# Patient Record
Sex: Female | Born: 1995 | Race: Black or African American | Hispanic: No | Marital: Single | Smoking: Never smoker
Health system: Southern US, Community
[De-identification: ages and names within clinical notes are randomized; demographics above are authoritative.]

## PROBLEM LIST (undated history)

## (undated) HISTORY — PX: NO PAST SURGERIES: SHX2092

---
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
## 2014-02-27 ENCOUNTER — Ambulatory Visit: Payer: Self-pay | Admitting: Family Medicine

## 2014-02-27 LAB — URINALYSIS, COMPLETE
Blood: NEGATIVE
Glucose,UR: NEGATIVE mg/dL (ref 0–75)
KETONE: NEGATIVE
Leukocyte Esterase: NEGATIVE
NITRITE: NEGATIVE
Ph: 6 (ref 4.5–8.0)
RBC, UR: NONE SEEN /HPF (ref 0–5)
Specific Gravity: 1.025 (ref 1.003–1.030)

## 2014-02-27 LAB — PREGNANCY, URINE: PREGNANCY TEST, URINE: NEGATIVE m[IU]/mL

## 2016-02-13 ENCOUNTER — Encounter: Payer: Self-pay | Admitting: Emergency Medicine

## 2016-02-13 ENCOUNTER — Ambulatory Visit
Admission: EM | Admit: 2016-02-13 | Discharge: 2016-02-13 | Disposition: A | Discharge status: Home / Self Care | Attending: Family Medicine | Admitting: Family Medicine

## 2016-02-13 DIAGNOSIS — N946 Dysmenorrhea, unspecified: Secondary | ICD-10-CM | POA: Diagnosis not present

## 2016-02-13 LAB — URINALYSIS COMPLETE WITH MICROSCOPIC (ARMC ONLY)
Bilirubin Urine: NEGATIVE
Glucose, UA: NEGATIVE mg/dL
Ketones, ur: NEGATIVE mg/dL
Leukocytes, UA: NEGATIVE
Nitrite: NEGATIVE
PROTEIN: 30 mg/dL — AB
SPECIFIC GRAVITY, URINE: 1.015 (ref 1.005–1.030)
pH: 8.5 — ABNORMAL HIGH (ref 5.0–8.0)

## 2016-02-13 LAB — PREGNANCY, URINE: Preg Test, Ur: NEGATIVE

## 2016-02-13 MED ORDER — KETOROLAC TROMETHAMINE 60 MG/2ML IM SOLN
60.0000 mg | Freq: Once | INTRAMUSCULAR | Status: AC
  Administered 2016-02-13: 60 mg via INTRAMUSCULAR

## 2016-02-13 NOTE — ED Provider Notes (Signed)
[facility]  ____________________________________________  Time seen: Approximately 2:30 PM  I have reviewed the triage vital signs and the nursing notes.   HISTORY  Chief Complaint Menstrual Problem  HPI Amber Higgins is a 20 y.o. female presents with a complaint of painful menstrual cramps. Patient states that her pain woke her up at approximately 3 or 4 AM this morning and states a few hours later she began with her menstrual bleeding. Patient states that this is her normal pattern as long as she can remember of her having her period. Patient states that the first day of her menstrual has always been accompanied with painful pelvic cramping. Patient states that normally when she noted she is about to begin her menstrual she will take Advil or Aleve a few times the day before which helps the severity of the pain. Patient states that she forgot to do this yesterday and states that she feels like this is why the pain was more today. Patient reports that she did take Aleve this morning and states that currently her pain is mild. Patient states that her pain is much improved with Aleve this morning. Patient states the pain was more significant when she first checked in to the urgent care but states as she laid down and rested in the exam room she began to feel better. Patient states current pain is 3 out of 10 to lower pelvic. Patient states the pain is already across lower pelvis and not just to one side.   patient again reports that this is consistent with her regular menstrual cycles. Denies any atypical pain or symptoms. Patient states that she has only gone through one menstrual pad today. Denies any heavy or irregular bleeding. Patient reports that she is not sexually active. Denies any recent abnormal vaginal discharge, vaginal odor, vaginal pain, dysuria or back pain. Denies fevers or recent sickness. Patient reports that she has never followed with OB/GYN in the past for this. Reports  she has never taken oral contraceptives or other methods of controlling her menstrual.  Patient reports she was seen in ER in 2012 for the same complaints and had a normal pelvic ultrasound.   Patient's last menstrual period was 02/13/2016 (exact date).   History reviewed. No pertinent past medical history. denies There are no active problems to display for this patient. denies  History reviewed. No pertinent past surgical history. Denies  No current outpatient prescriptions on file.  Allergies Review of patient's allergies indicates no known allergies.  History reviewed. No pertinent family history.  Social History Social History  Substance Use Topics  . Smoking status: Never Smoker   . Smokeless tobacco: None  . Alcohol Use: No    Review of Systems Constitutional: No fever/chills Eyes: No visual changes. ENT: No sore throat. Cardiovascular: Denies chest pain. Respiratory: Denies shortness of breath. Gastrointestinal: As above. No nausea, no vomiting.  No diarrhea.  No constipation. Genitourinary: Negative for dysuria. Musculoskeletal: Negative for back pain. Skin: Negative for rash. Neurological: Negative for headaches, focal weakness or numbness.  10-point ROS otherwise negative.  ____________________________________________   PHYSICAL EXAM:  VITAL SIGNS: ED Triage Vitals  Enc Vitals Group     BP 02/13/16 1254 116/82 mmHg     Pulse Rate 02/13/16 1254 71     Resp 02/13/16 1254 16     Temp 02/13/16 1254 98.2 F (36.8 C)     Temp Source 02/13/16 1254 Oral     SpO2 02/13/16 1254 100 %  Weight 02/13/16 1254 119 lb (53.978 kg)     Height 02/13/16 1254  (1.651 m)     Head Cir --      Peak Flow --      Pain Score 02/13/16 1300 7     Pain Loc --      Pain Edu? --      Excl. in GC? --     Constitutional: Alert and oriented. Well appearing and in no acute distress. Eyes: Conjunctivae are normal. PERRL. EOMI. Head: Atraumatic.   Nose: No  congestion/rhinnorhea.  Mouth/Throat: Mucous membranes are moist. Neck: No stridor.  No cervical spine tenderness to palpation.. Cardiovascular: Normal rate, regular rhythm. Grossly normal heart sounds.  Good peripheral circulation. Respiratory: Normal respiratory effort.  No retractions. Lungs CTAB. No wheezes, rales or rhonchi.  Gastrointestinal: Soft and nontender. No abdominal or pelvic tenderness to palpation. No distention. Normal Bowel sounds. No CVA tenderness. Pelvic: patient declined.  Musculoskeletal: No lower or upper extremity tenderness nor edema.  Bilateral pedal pulses equal and easily palpated.  Neurologic:  Normal speech and language. No gross focal neurologic deficits are appreciated. No gait instability. Skin:  Skin is warm, dry and intact. No rash noted. Psychiatric: Mood and affect are normal. Speech and behavior are normal.  ____________________________________________   LABS (all labs ordered are listed, but only abnormal results are displayed)  Labs Reviewed  URINALYSIS COMPLETEWITH MICROSCOPIC ([facility] ONLY) - Abnormal; Notable for the following:    Hgb urine dipstick TRACE (*)    pH 8.5 (*)    Protein, ur 30 (*)    Bacteria, UA FEW (*)    Squamous Epithelial / LPF 0-5 (*)    All other components within normal limits  URINE CULTURE  PREGNANCY, URINE    INITIAL IMPRESSION / ASSESSMENT AND PLAN / ED COURSE  Pertinent labs & imaging results that were available during my care of the patient were reviewed by me and considered in my medical decision making (see chart for details).  Very well-appearing patient. No acute distress. Presenting for the complaints of dysmenorrhea which per patient is consistent with her normal menstrual cycles. Patient reports that pain was worse this morning and she did not take Aleve prior to onset of her menstrual which she normally does. Reports that her periods have had the same pattern for many years. Patient states that the pain is  generally only present on day 1 of her menstrual. Urine pregnancy negative. Urinalysis positive for few bacteria, 0-5 squamous epithelial cells, 0-5 wbc's and trace hemoglobin. As patient denies dysuria Will culture urinalysis, doubt UTI and will await urine culture. 60 mg IM Toradol once in urgent care.  After Toradol, patient reports pain fully resolved. Abdomen soft and nontender. Patient denies any pain at this time. Patient reports she is feeling well. Discussed in detail with patient the need to follow-up with OB/GYN or primary doctor. Patient expresses interest in starting oral contraceptives to control her menstruals, expresses some leeriness in having insertable devices or injections as she states that she is scared of needles. Counseled patient to follow-up closely. Encouraged patient for any increased concerns or return of pain to be reevaluated at urgent care or proceed directly to the emergency room for further evaluation and likely ultrasound.   Discussed follow up with Primary care physician this week. Discussed follow up and return parameters including no resolution or any worsening concerns. Patient verbalized understanding and agreed to plan.   ____________________________________________   FINAL CLINICAL IMPRESSION(S) /  ED DIAGNOSES  Final diagnoses:  Dysmenorrhea      Note: This dictation was prepared with Dragon dictation along with smaller phrase technology. Any transcriptional errors that result from this process are unintentional.    Renford Dills, NP 02/13/16 1613  Renford Dills, NP 02/13/16 [phone]

## 2016-02-13 NOTE — ED Notes (Signed)
Patient c/o menstrual cramps that started last night.  Patient denies heavy menstrual bleeding.  Patient states that Ibuprofen and Naproxen are not helping her pain.

## 2016-02-13 NOTE — Discharge Instructions (Signed)
Take over the counter tylenol or ibuprofen as needed. Use heating pad as needed as discussed.  Drink plenty of fluids.    Follow up with your primary care physician or OBGYN this week as discussed. See above to call.   Return to Urgent care or proceed directly to Emergency room for increased pain, abnormal bleeding, new or worsening concerns.    Dysmenorrhea Menstrual cramps (dysmenorrhea) are caused by the muscles of the uterus tightening (contracting) during a menstrual period. For some women, this discomfort is merely bothersome. For others, dysmenorrhea can be severe enough to interfere with everyday activities for a few days each month. Primary dysmenorrhea is menstrual cramps that last a couple of days when you start having menstrual periods or soon after. This often begins after a teenager starts having her period. As a woman gets older or has a baby, the cramps will usually lessen or disappear. Secondary dysmenorrhea begins later in life, lasts longer, and the pain may be stronger than primary dysmenorrhea. The pain may start before the period and last a few days after the period.  CAUSES  Dysmenorrhea is usually caused by an underlying problem, such as:  The tissue lining the uterus grows outside of the uterus in other areas of the body (endometriosis).  The endometrial tissue, which normally lines the uterus, is found in or grows into the muscular walls of the uterus (adenomyosis).  The pelvic blood vessels are engorged with blood just before the menstrual period (pelvic congestive syndrome).  Overgrowth of cells (polyps) in the lining of the uterus or cervix.  Falling down of the uterus (prolapse) because of loose or stretched ligaments.  Depression.  Bladder problems, infection, or inflammation.  Problems with the intestine, a tumor, or irritable bowel syndrome.  Cancer of the female organs or bladder.  A severely tipped uterus.  A very tight opening or closed  cervix.  Noncancerous tumors of the uterus (fibroids).  Pelvic inflammatory disease (PID).  Pelvic scarring (adhesions) from a previous surgery.  Ovarian cyst.  An intrauterine device (IUD) used for birth control. RISK FACTORS You may be at greater risk of dysmenorrhea if:  You are younger than age 35.  You started puberty early.  You have irregular or heavy bleeding.  You have never given birth.  You have a family history of this problem.  You are a smoker. SIGNS AND SYMPTOMS   Cramping or throbbing pain in your lower abdomen.  Headaches.  Lower back pain.  Nausea or vomiting.  Diarrhea.  Sweating or dizziness.  Loose stools. DIAGNOSIS  A diagnosis is based on your history, symptoms, physical exam, diagnostic tests, or procedures. Diagnostic tests or procedures may include:  Blood tests.  Ultrasonography.  An examination of the lining of the uterus (dilation and curettage, D&C).  An examination inside your abdomen or pelvis with a scope (laparoscopy).  X-rays.  CT scan.  MRI.  An examination inside the bladder with a scope (cystoscopy).  An examination inside the intestine or stomach with a scope (colonoscopy, gastroscopy). TREATMENT  Treatment depends on the cause of the dysmenorrhea. Treatment may include:  Pain medicine prescribed by your health care provider.  Birth control pills or an IUD with progesterone hormone in it.  Hormone replacement therapy.  Nonsteroidal anti-inflammatory drugs (NSAIDs). These may help stop the production of prostaglandins.  Surgery to remove adhesions, endometriosis, ovarian cyst, or fibroids.  Removal of the uterus (hysterectomy).  Progesterone shots to stop the menstrual period.  Cutting the nerves on  the sacrum that go to the female organs (presacral neurectomy).  Electric current to the sacral nerves (sacral nerve stimulation).  Antidepressant medicine.  Psychiatric therapy, counseling, or group  therapy.  Exercise and physical therapy.  Meditation and yoga therapy.  Acupuncture. HOME CARE INSTRUCTIONS   Only take over-the-counter or prescription medicines as directed by your health care provider.  Place a heating pad or hot water bottle on your lower back or abdomen. Do not sleep with the heating pad.  Use aerobic exercises, walking, swimming, biking, and other exercises to help lessen the cramping.  Massage to the lower back or abdomen may help.  Stop smoking.  Avoid alcohol and caffeine. SEEK MEDICAL CARE IF:   Your pain does not get better with medicine.  You have pain with sexual intercourse.  Your pain increases and is not controlled with medicines.  You have abnormal vaginal bleeding with your period.  You develop nausea or vomiting with your period that is not controlled with medicine. SEEK IMMEDIATE MEDICAL CARE IF:  You pass out.    This information is not intended to replace advice given to you by your health care provider. Make sure you discuss any questions you have with your health care provider.   Document Released: 10/12/2005 Document Revised: 06/14/2013 Document Reviewed: 03/30/2013 Elsevier Interactive Patient Education [employer]2016 [employer]

## 2016-02-15 LAB — URINE CULTURE

## 2016-02-18 ENCOUNTER — Telehealth: Payer: Self-pay | Admitting: *Deleted

## 2016-02-18 NOTE — ED Notes (Signed)
Called patient and informed her that her urine culture came back negative for UTI. Patient confirmed understanding of information.

## 2016-02-21 ENCOUNTER — Ambulatory Visit (INDEPENDENT_AMBULATORY_CARE_PROVIDER_SITE_OTHER): Admitting: Internal Medicine

## 2016-02-21 ENCOUNTER — Encounter: Payer: Self-pay | Admitting: Internal Medicine

## 2016-02-21 VITALS — BP 98/62 | HR 76 | Ht 65.0 in | Wt 122.0 lb

## 2016-02-21 DIAGNOSIS — N946 Dysmenorrhea, unspecified: Secondary | ICD-10-CM | POA: Diagnosis not present

## 2016-02-21 DIAGNOSIS — M259 Joint disorder, unspecified: Secondary | ICD-10-CM

## 2016-02-21 MED ORDER — NORGESTIM-ETH ESTRAD TRIPHASIC 0.18/0.215/0.25 MG-35 MCG PO TABS: 1.0000 | ORAL_TABLET | Freq: Every day | ORAL | Status: DC

## 2016-02-21 NOTE — Progress Notes (Signed)
Date:  02/21/2016   Name:  Amber Higgins   DOB:  [birth year]   MRN:  [id number]  New patient to establish care.  Chief Complaint: New Evaluation and Menstrual Problem Shoulder Injury  The right shoulder is affected. Injury mechanism: injury occured during birth. The patient is experiencing no pain. Pertinent negatives include no chest pain, muscle weakness, numbness or tingling. Associated symptoms comments: She has limited ROM on the right.  She has done physical therapy several times over the years but never gained any increase in movement.. Improvement on treatment: she would like to have better movement and would like to consult a specialist.   Patient presents to discuss severe menstrual cramps.  She started menstruating around age 20.  She has a cycle every 30 days or so - she does not track it.  She has moderate flow lasting 5 days.  Her cramping starts the day before the bleeding.  If she takes advil in advance, she can reduce the discomfort.  Recently it seems that the cramping is worse and she is interested in OCPs to help regulate.  She is not sexually active and does not need them for contraception.  She does not smoke.  She has no history DVT or PE.   Review of Systems  Constitutional: Negative for fever, chills, fatigue and unexpected weight change.  Eyes: Negative for visual disturbance.  Respiratory: Negative for cough, chest tightness, shortness of breath and wheezing.   Cardiovascular: Negative for chest pain, palpitations and leg swelling.  Gastrointestinal: Negative for abdominal pain and blood in stool.  Genitourinary: Positive for menstrual problem. Negative for difficulty urinating.  Musculoskeletal: Negative for arthralgias.       Right shoulder  Neurological: Negative for tingling and numbness.  Hematological: Negative for adenopathy.  Psychiatric/Behavioral: Negative for sleep disturbance and dysphoric mood.    There are no active problems to display for this  patient.   Prior to Admission medications   Medication Sig Start Date End Date Taking? Authorizing Provider  Multiple Vitamins-Minerals (MULTIVITAMIN WITH MINERALS) tablet Take 1 tablet by mouth daily.   Yes Historical Provider, MD    No Known Allergies  Past Surgical History  Procedure Laterality Date  . No past surgeries      Social History  Substance Use Topics  . Smoking status: Never Smoker   . Smokeless tobacco: None  . Alcohol Use: No    Medication list has been reviewed and updated.  Physical Exam  Constitutional: She is oriented to person, place, and time. She appears well-developed. No distress.  HENT:  Head: Normocephalic and atraumatic.  Neck: Normal range of motion. Neck supple. No thyromegaly present.  Cardiovascular: Normal rate, regular rhythm and normal heart sounds.   Pulmonary/Chest: Effort normal and breath sounds normal. No respiratory distress.  Abdominal: Soft. Bowel sounds are normal. She exhibits no distension. There is no tenderness. There is no rebound.  Musculoskeletal: She exhibits no edema.       Right shoulder: She exhibits decreased range of motion. She exhibits no bony tenderness and no swelling.       Left shoulder: She exhibits normal range of motion, no tenderness and no bony tenderness.  Unable to rotate right humerus past midline Abduction to 80 deg before the clavicle/scapula elevate Grip 5/5 bilaterally Biceps 5/5 bilaterally  Lymphadenopathy:    She has no cervical adenopathy.  Neurological: She is alert and oriented to person, place, and time.  Reflex Scores:      Bicep  reflexes are 2+ on the right side and 2+ on the left side.      Patellar reflexes are 2+ on the right side and 2+ on the left side. Skin: Skin is warm and dry. No rash noted.  Psychiatric: She has a normal mood and affect. Her behavior is normal. Thought content normal.  Nursing note and vitals reviewed.   BP 98/62 mmHg  Pulse 76  Ht 5\' 5"  (1.651 m)  Wt 122  lb (55.339 kg)  BMI 20.30 kg/m2  LMP 02/13/2016 (Exact Date)  Assessment and Plan: 1. Dysmenorrhea Can begin OCPs in 2 days; expect improvement in several months Will need to have Pap and Pelvic this year - Norgestimate-Ethinyl Estradiol Triphasic (ORTHO TRI-CYCLEN, 28,) 0.18/0.215/0.25 MG-35 MCG tablet; Take 1 tablet by mouth daily.  Dispense: 1 Package; Refill: 11  2. Joint problem, shoulder - Ambulatory referral to Orthopedic Surgery   Bari EdwardLaura Berglund, MD [facility]  02/21/2016

## 2016-02-21 NOTE — Patient Instructions (Addendum)
Health Maintenance, Female Adopting a healthy lifestyle and getting preventive care can go a long way to promote health and wellness. Talk with your health care provider about what schedule of regular examinations is right for you. This is a good chance for you to check in with your provider about disease prevention and staying healthy. In between checkups, there are plenty of things you can do on your own. Experts have done a lot of research about which lifestyle changes and preventive measures are most likely to keep you healthy. Ask your health care provider for more information. WEIGHT AND DIET  Eat a healthy diet  Be sure to include plenty of vegetables, fruits, low-fat dairy products, and lean protein.  Do not eat a lot of foods high in solid fats, added sugars, or salt.  Get regular exercise. This is one of the most important things you can do for your health.  Most adults should exercise for at least 150 minutes each week. The exercise should increase your heart rate and make you sweat (moderate-intensity exercise).  Most adults should also do strengthening exercises at least twice a week. This is in addition to the moderate-intensity exercise.  Maintain a healthy weight  Body mass index (BMI) is a measurement that can be used to identify possible weight problems. It estimates body fat based on height and weight. Your health care provider can help determine your BMI and help you achieve or maintain a healthy weight.  For females 20 years of age and older:   A BMI below 18.5 is considered underweight.  A BMI of 18.5 to 24.9 is normal.  A BMI of 25 to 29.9 is considered overweight.  A BMI of 30 and above is considered obese.  Watch levels of cholesterol and blood lipids  You should start having your blood tested for lipids and cholesterol at 20 years of age, then have this test every 5 years.  You may need to have your cholesterol levels checked more often if:  Your lipid  or cholesterol levels are high.  You are older than 21 years of age.  You are at high risk for heart disease.  CANCER SCREENING   Lung Cancer  Lung cancer screening is recommended for adults 55-80 years old who are at high risk for lung cancer because of a history of smoking.  A yearly low-dose CT scan of the lungs is recommended for people who:  Currently smoke.  Have quit within the past 15 years.  Have at least a 30-pack-year history of smoking. A pack year is smoking an average of one pack of cigarettes a day for 1 year.  Yearly screening should continue until it has been 15 years since you quit.  Yearly screening should stop if you develop a health problem that would prevent you from having lung cancer treatment.  Breast Cancer  Practice breast self-awareness. This means understanding how your breasts normally appear and feel.  It also means doing regular breast self-exams. Let your health care provider know about any changes, no matter how small.  If you are in your 20s or 30s, you should have a clinical breast exam (CBE) by a health care provider every 1-3 years as part of a regular health exam.  If you are 40 or older, have a CBE every year. Also consider having a breast X-ray (mammogram) every year.  If you have a family history of breast cancer, talk to your health care provider about genetic screening.  If you   are at high risk for breast cancer, talk to your health care provider about having an MRI and a mammogram every year.  Breast cancer gene (BRCA) assessment is recommended for women who have family members with BRCA-related cancers. BRCA-related cancers include:  Breast.  Ovarian.  Tubal.  Peritoneal cancers.  Results of the assessment will determine the need for genetic counseling and BRCA1 and BRCA2 testing. Cervical Cancer Your health care provider may recommend that you be screened regularly for cancer of the pelvic organs (ovaries, uterus, and  vagina). This screening involves a pelvic examination, including checking for microscopic changes to the surface of your cervix (Pap test). You may be encouraged to have this screening done every 3 years, beginning at age 21.  For women ages 30-65, health care providers may recommend pelvic exams and Pap testing every 3 years, or they may recommend the Pap and pelvic exam, combined with testing for human papilloma virus (HPV), every 5 years. Some types of HPV increase your risk of cervical cancer. Testing for HPV may also be done on women of any age with unclear Pap test results.  Other health care providers may not recommend any screening for nonpregnant women who are considered low risk for pelvic cancer and who do not have symptoms. Ask your health care provider if a screening pelvic exam is right for you.  If you have had past treatment for cervical cancer or a condition that could lead to cancer, you need Pap tests and screening for cancer for at least 20 years after your treatment. If Pap tests have been discontinued, your risk factors (such as having a new sexual partner) need to be reassessed to determine if screening should resume. Some women have medical problems that increase the chance of getting cervical cancer. In these cases, your health care provider may recommend more frequent screening and Pap tests. Colorectal Cancer  This type of cancer can be detected and often prevented.  Routine colorectal cancer screening usually begins at 21 years of age and continues through 21 years of age.  Your health care provider may recommend screening at an earlier age if you have risk factors for colon cancer.  Your health care provider may also recommend using home test kits to check for hidden blood in the stool.  A small camera at the end of a tube can be used to examine your colon directly (sigmoidoscopy or colonoscopy). This is done to check for the earliest forms of colorectal  cancer.  Routine screening usually begins at age 50.  Direct examination of the colon should be repeated every 5-10 years through 21 years of age. However, you may need to be screened more often if early forms of precancerous polyps or small growths are found. Skin Cancer  Check your skin from head to toe regularly.  Tell your health care provider about any new moles or changes in moles, especially if there is a change in a mole's shape or color.  Also tell your health care provider if you have a mole that is larger than the size of a pencil eraser.  Always use sunscreen. Apply sunscreen liberally and repeatedly throughout the day.  Protect yourself by wearing long sleeves, pants, a wide-brimmed hat, and sunglasses whenever you are outside. HEART DISEASE, DIABETES, AND HIGH BLOOD PRESSURE   High blood pressure causes heart disease and increases the risk of stroke. High blood pressure is more likely to develop in:  People who have blood pressure in the high end   of the normal range (130-139/85-89 mm Hg).  People who are overweight or obese.  People who are African American.  If you are 18-39 years of age, have your blood pressure checked every 3-5 years. If you are 40 years of age or older, have your blood pressure checked every year. You should have your blood pressure measured twice--once when you are at a hospital or clinic, and once when you are not at a hospital or clinic. Record the average of the two measurements. To check your blood pressure when you are not at a hospital or clinic, you can use:  An automated blood pressure machine at a pharmacy.  A home blood pressure monitor.  If you are between 55 years and 79 years old, ask your health care provider if you should take aspirin to prevent strokes.  Have regular diabetes screenings. This involves taking a blood sample to check your fasting blood sugar level.  If you are at a normal weight and have a low risk for diabetes,  have this test once every three years after 21 years of age.  If you are overweight and have a high risk for diabetes, consider being tested at a younger age or more often. PREVENTING INFECTION  Hepatitis B  If you have a higher risk for hepatitis B, you should be screened for this virus. You are considered at high risk for hepatitis B if:  You were born in a country where hepatitis B is common. Ask your health care provider which countries are considered high risk.  Your parents were born in a high-risk country, and you have not been immunized against hepatitis B (hepatitis B vaccine).  You have HIV or AIDS.  You use needles to inject street drugs.  You live with someone who has hepatitis B.  You have had sex with someone who has hepatitis B.  You get hemodialysis treatment.  You take certain medicines for conditions, including cancer, organ transplantation, and autoimmune conditions. Hepatitis C  Blood testing is recommended for:  Everyone born from 1945 through 1965.  Anyone with known risk factors for hepatitis C. Sexually transmitted infections (STIs)  You should be screened for sexually transmitted infections (STIs) including gonorrhea and chlamydia if:  You are sexually active and are younger than 21 years of age.  You are older than 21 years of age and your health care provider tells you that you are at risk for this type of infection.  Your sexual activity has changed since you were last screened and you are at an increased risk for chlamydia or gonorrhea. Ask your health care provider if you are at risk.  If you do not have HIV, but are at risk, it may be recommended that you take a prescription medicine daily to prevent HIV infection. This is called pre-exposure prophylaxis (PrEP). You are considered at risk if:  You are sexually active and do not regularly use condoms or know the HIV status of your partner(s).  You take drugs by injection.  You are sexually  active with a partner who has HIV. Talk with your health care provider about whether you are at high risk of being infected with HIV. If you choose to begin PrEP, you should first be tested for HIV. You should then be tested every 3 months for as long as you are taking PrEP.  PREGNANCY   If you are premenopausal and you may become pregnant, ask your health care provider about preconception counseling.  If you may   become pregnant, take 400 to 800 micrograms (mcg) of folic acid every day.  If you want to prevent pregnancy, talk to your health care provider about birth control (contraception). OSTEOPOROSIS AND MENOPAUSE   Osteoporosis is a disease in which the bones lose minerals and strength with aging. This can result in serious bone fractures. Your risk for osteoporosis can be identified using a bone density scan.  If you are 65 years of age or older, or if you are at risk for osteoporosis and fractures, ask your health care provider if you should be screened.  Ask your health care provider whether you should take a calcium or vitamin D supplement to lower your risk for osteoporosis.  Menopause may have certain physical symptoms and risks.  Hormone replacement therapy may reduce some of these symptoms and risks. Talk to your health care provider about whether hormone replacement therapy is right for you.  HOME CARE INSTRUCTIONS   Schedule regular health, dental, and eye exams.  Stay current with your immunizations.   Do not use any tobacco products including cigarettes, chewing tobacco, or electronic cigarettes.  If you are pregnant, do not drink alcohol.  If you are breastfeeding, limit how much and how often you drink alcohol.  Limit alcohol intake to no more than 1 drink per day for nonpregnant women. One drink equals 12 ounces of beer, 5 ounces of wine, or 1 ounces of hard liquor.  Do not use street drugs.  Do not share needles.  Ask your health care provider for help if  you need support or information about quitting drugs.  Tell your health care provider if you often feel depressed.  Tell your health care provider if you have ever been abused or do not feel safe at home.   This information is not intended to replace advice given to you by your health care provider. Make sure you discuss any questions you have with your health care provider.   Document Released: 04/27/2011 Document Revised: 11/02/2014 Document Reviewed: 09/13/2013 Elsevier Interactive Patient Education 2016 [employer]  

## 2016-07-13 ENCOUNTER — Encounter: Payer: Self-pay | Admitting: Medical Oncology

## 2016-07-13 ENCOUNTER — Emergency Department
Admission: EM | Admit: 2016-07-13 | Discharge: 2016-07-13 | Disposition: A | Discharge status: Home / Self Care | Attending: Emergency Medicine | Admitting: Emergency Medicine

## 2016-07-13 DIAGNOSIS — Z5181 Encounter for therapeutic drug level monitoring: Secondary | ICD-10-CM | POA: Diagnosis not present

## 2016-07-13 DIAGNOSIS — R55 Syncope and collapse: Secondary | ICD-10-CM | POA: Insufficient documentation

## 2016-07-13 LAB — CBC
HCT: 38.9 % (ref 35.0–47.0)
HEMOGLOBIN: 13.2 g/dL (ref 12.0–16.0)
MCH: 30.7 pg (ref 26.0–34.0)
MCHC: 34 g/dL (ref 32.0–36.0)
MCV: 90.2 fL (ref 80.0–100.0)
Platelets: 210 10*3/uL (ref 150–440)
RBC: 4.32 MIL/uL (ref 3.80–5.20)
RDW: 12.9 % (ref 11.5–14.5)
WBC: 6.2 10*3/uL (ref 3.6–11.0)

## 2016-07-13 LAB — URINALYSIS COMPLETE WITH MICROSCOPIC (ARMC ONLY)
Bilirubin Urine: NEGATIVE
GLUCOSE, UA: NEGATIVE mg/dL
Hgb urine dipstick: NEGATIVE
Leukocytes, UA: NEGATIVE
NITRITE: NEGATIVE
Protein, ur: 30 mg/dL — AB
RBC / HPF: NONE SEEN RBC/hpf (ref 0–5)
SPECIFIC GRAVITY, URINE: 1.021 (ref 1.005–1.030)
pH: 5 (ref 5.0–8.0)

## 2016-07-13 LAB — COMPREHENSIVE METABOLIC PANEL
ALK PHOS: 30 U/L — AB (ref 38–126)
ALT: 13 U/L — AB (ref 14–54)
AST: 21 U/L (ref 15–41)
Albumin: 4 g/dL (ref 3.5–5.0)
Anion gap: 8 (ref 5–15)
BUN: 13 mg/dL (ref 6–20)
CALCIUM: 9 mg/dL (ref 8.9–10.3)
CO2: 22 mmol/L (ref 22–32)
CREATININE: 0.89 mg/dL (ref 0.44–1.00)
Chloride: 106 mmol/L (ref 101–111)
Glucose, Bld: 87 mg/dL (ref 65–99)
Potassium: 3.6 mmol/L (ref 3.5–5.1)
Sodium: 136 mmol/L (ref 135–145)
TOTAL PROTEIN: 6.9 g/dL (ref 6.5–8.1)
Total Bilirubin: 0.4 mg/dL (ref 0.3–1.2)

## 2016-07-13 LAB — URINE DRUG SCREEN, QUALITATIVE (ARMC ONLY)
AMPHETAMINES, UR SCREEN: NOT DETECTED
Barbiturates, Ur Screen: NOT DETECTED
Benzodiazepine, Ur Scrn: NOT DETECTED
CANNABINOID 50 NG, UR ~~LOC~~: NOT DETECTED
Cocaine Metabolite,Ur ~~LOC~~: NOT DETECTED
MDMA (ECSTASY) UR SCREEN: NOT DETECTED
METHADONE SCREEN, URINE: NOT DETECTED
Opiate, Ur Screen: NOT DETECTED
Phencyclidine (PCP) Ur S: NOT DETECTED
TRICYCLIC, UR SCREEN: NOT DETECTED

## 2016-07-13 LAB — POCT PREGNANCY, URINE: PREG TEST UR: NEGATIVE

## 2016-07-13 MED ORDER — SODIUM CHLORIDE 0.9 % IV BOLUS (SEPSIS)
1000.0000 mL | Freq: Once | INTRAVENOUS | Status: AC
  Administered 2016-07-13: 1000 mL via INTRAVENOUS

## 2016-07-13 NOTE — ED Notes (Addendum)
Pt states she remembers heart racing at work and then states witnesses state she passed out and had jerking.  Pt came to and EMS brought her in to the ED.  States she hasn't had any seizures since she was 89 years old.  Mother states pt has gotten little sleep in the last few days.

## 2016-07-13 NOTE — ED Provider Notes (Signed)
[facility] Emergency Department Provider Note  Time seen: 10:13 AM  I have reviewed the triage vital signs and the nursing notes.   HISTORY  Chief Complaint Seizures    HPI Amber Higgins is a 20 y.o. female with no past medical history who presents to the emergency department after a possible seizure. According to the patient she was at work when she began feeling her heart racing, states she felt like she is going to pass out so she sat down. The next thing she remembers is being on the ground with people around her, and EMS had been called. Per the patient she states coworkers said she was shaking. Patient believes she was confused when she awoke, denies incontinence. Did not bite her tongue. Mom states the patient had a seizure at 89 years old but has not had any since. She does not recall the patient had a fever at that time. Patient admits that she was drinking over the weekend as it was her 20st birthday, she only got 4 hours of sleep last night before going to work. Patient denies any headache. Denies any head pain. Denies focal weakness or numbness.  History reviewed. No pertinent past medical history.  There are no active problems to display for this patient.   Past Surgical History:  Procedure Laterality Date  . NO PAST SURGERIES      Prior to Admission medications   Medication Sig Start Date End Date Taking? Authorizing Provider  Multiple Vitamins-Minerals (MULTIVITAMIN WITH MINERALS) tablet Take 1 tablet by mouth daily.    Historical Provider, MD  Norgestimate-Ethinyl Estradiol Triphasic (ORTHO TRI-CYCLEN, 28,) 0.18/0.215/0.25 MG-35 MCG tablet Take 1 tablet by mouth daily. 02/21/16   Reubin Milan, MD    No Known Allergies  Family History  Problem Relation Age of Onset  . Diabetes Mother   . Breast cancer Maternal Grandmother     Social History Social History  Substance Use Topics  . Smoking status: Never Smoker  . Smokeless tobacco:  Not on file  . Alcohol use No    Review of Systems Constitutional: Negative for fever. Cardiovascular: Negative for chest pain. Respiratory: Negative for shortness of breath. Gastrointestinal: Negative for abdominal pain Neurological: Negative for headaches, focal weakness or numbness. 10-point ROS otherwise negative.  ____________________________________________   PHYSICAL EXAM:  VITAL SIGNS: ED Triage Vitals [07/13/16 0918]  Enc Vitals Group     BP 109/73     Pulse Rate 75     Resp 16     Temp 97.5 F (36.4 C)     Temp Source Oral     SpO2 100 %     Weight 119 lb (54 kg)     Height 5\' 5"  (1.651 m)     Head Circumference      Peak Flow      Pain Score      Pain Loc      Pain Edu?      Excl. in GC?     Constitutional: Alert and oriented. Well appearing and in no distress. Eyes: Normal exam ENT   Head: Normocephalic and atraumatic.   Mouth/Throat: Mucous membranes are moist. No oral trauma Cardiovascular: Normal rate, regular rhythm. No murmur Respiratory: Normal respiratory effort without tachypnea nor retractions. Breath sounds are clear  Gastrointestinal: Soft and nontender. No distention.  Musculoskeletal: Nontender with normal range of motion in all extremities.  Neurologic:  Normal speech and language. No gross focal neurologic deficits Skin:  Skin is warm,  dry and intact.  Psychiatric: Mood and affect are normal. Speech and behavior are normal.   ____________________________________________    EKG  EKG reviewed and interpreted, such as normal sinus rhythm at 83 bpm, narrow QRS, normal axis, normal intervals, no ST changes. Overall normal EKG.  ____________________________________________  INITIAL IMPRESSION / ASSESSMENT AND PLAN / ED COURSE  Pertinent labs & imaging results that were available during my care of the patient were reviewed by me and considered in my medical decision making (see chart for details).  The patient presents the  emergency department with syncope versus seizure. Currently the patient appears very well. Patient does admit to drinking over the weekend, only obtaining 4 hours sleep last night before going to work today. Patient has no seizure history besides 20 years old. Does not take any seizure medications. Denies any drug use or new medications. We will check labs and closely monitor in the emergency department. We'll IV hydrate while awaiting lab results. As the patient felt the symptoms coming on I suspect likely syncope however as the patient had shaking activity if everything comes back normal we will have her follow with neurology for further evaluation.  Patient's labs are within normal limits. We will discharge home with neurology follow-up. Patient and mother are agreeable.  ____________________________________________   FINAL CLINICAL IMPRESSION(S) / ED DIAGNOSES  Syncope    Minna AntisKevin Antrice Pal, MD 07/13/16 1221

## 2016-07-13 NOTE — ED Triage Notes (Signed)
Pt was at work when she began to feel "weird" so she sat down, per co workers pt had small seizure. Pt had seizure when she was 3 but on no meds. Pt states that she is in school and under stress, denies pain. Medic reports pt was postictal up arrival. Denies hitting head.

## 2016-11-13 ENCOUNTER — Other Ambulatory Visit: Payer: Self-pay | Admitting: Internal Medicine

## 2016-11-13 DIAGNOSIS — N946 Dysmenorrhea, unspecified: Secondary | ICD-10-CM

## 2020-04-19 ENCOUNTER — Ambulatory Visit: Attending: Internal Medicine

## 2020-04-19 DIAGNOSIS — Z23 Encounter for immunization: Secondary | ICD-10-CM

## 2020-04-19 NOTE — Progress Notes (Signed)
   Covid-19 Vaccination Clinic  Name:  Amber Higgins    MRN: [id number] DOB: 02/20/1996  04/19/2020  [name] was observed post Covid-19 immunization for 15 minutes without incident. She was provided with Vaccine Information Sheet and instruction to access the V-Safe system.   [name] was instructed to call 911 with any severe reactions post vaccine: Marland Kitchen Difficulty breathing  . Swelling of face and throat  . A fast heartbeat  . A bad rash all over body  . Dizziness and weakness   Immunizations Administered    Name Date Dose VIS Date Route   Pfizer COVID-19 Vaccine 04/19/2020 11:18 AM 0.3 mL 12/20/2018 Intramuscular   Manufacturer: [employer], [employer]   Lot: [id number]   NDC: [address]-3

## 2020-05-10 ENCOUNTER — Ambulatory Visit: Attending: Internal Medicine

## 2020-05-10 DIAGNOSIS — Z23 Encounter for immunization: Secondary | ICD-10-CM

## 2020-05-10 NOTE — Progress Notes (Signed)
   Covid-19 Vaccination Clinic  Name:  Amber Higgins    MRN: [id number] DOB: 07-12-1996  05/10/2020  Amber Higgins was observed post Covid-19 immunization for 15 minutes without incident. She was provided with Vaccine Information Sheet and instruction to access the V-Safe system.   Amber Higgins was instructed to call 911 with any severe reactions post vaccine: Marland Kitchen Difficulty breathing  . Swelling of face and throat  . A fast heartbeat  . A bad rash all over body  . Dizziness and weakness   Immunizations Administered    Name Date Dose VIS Date Route   Pfizer COVID-19 Vaccine 05/10/2020 12:02 PM 0.3 mL 12/20/2018 Intramuscular   Manufacturer: [employer], [employer]   Lot: [id number]   NDC: [address]-0

## 2024-03-18 ENCOUNTER — Ambulatory Visit
Admission: EM | Admit: 2024-03-18 | Discharge: 2024-03-18 | Disposition: A | Discharge status: Home / Self Care | Attending: Physician Assistant | Admitting: Physician Assistant

## 2024-03-18 ENCOUNTER — Encounter: Payer: Self-pay | Admitting: Emergency Medicine

## 2024-03-18 DIAGNOSIS — J069 Acute upper respiratory infection, unspecified: Secondary | ICD-10-CM | POA: Insufficient documentation

## 2024-03-18 DIAGNOSIS — J029 Acute pharyngitis, unspecified: Secondary | ICD-10-CM | POA: Diagnosis present

## 2024-03-18 DIAGNOSIS — R051 Acute cough: Secondary | ICD-10-CM | POA: Diagnosis present

## 2024-03-18 LAB — GROUP A STREP BY PCR: Group A Strep by PCR: NOT DETECTED

## 2024-03-18 LAB — RESP PANEL BY RT-PCR (FLU A&B, COVID) ARPGX2
Influenza A by PCR: NEGATIVE
Influenza B by PCR: NEGATIVE
SARS Coronavirus 2 by RT PCR: NEGATIVE

## 2024-03-18 MED ORDER — PROMETHAZINE-DM 6.25-15 MG/5ML PO SYRP: 5.0000 mL | ORAL_SOLUTION | Freq: Four times a day (QID) | ORAL | 0 refills | Status: AC | PRN

## 2024-03-18 NOTE — ED Provider Notes (Signed)
 [facility]    CSN: [id number] Arrival date & time: 03/18/24  1010      History   Chief Complaint Chief Complaint  Patient presents with   Sore Throat   Cough    HPI Amber Higgins is a 28 y.o. female presenting for onset of fatigue, headaches, cough, congestion, sore throat and chills since yesterday.  Denies reported fever, chest pain, shortness of breath, abdominal pain, vomiting or diarrhea.  Recently flew to and from [place]  and says there were passengers on the plane coughing.  She has used Flonase but not taking any other medications.  No other concerns.Amber Higgins  HPI  History reviewed. No pertinent past medical history.  There are no active problems to display for this patient.   Past Surgical History:  Procedure Laterality Date   NO PAST SURGERIES      OB History   No obstetric history on file.      Home Medications    Prior to Admission medications   Medication Sig Start Date End Date Taking? Authorizing Provider  promethazine-dextromethorphan (PROMETHAZINE-DM) 6.25-15 MG/5ML syrup Take 5 mLs by mouth 4 (four) times daily as needed. 03/18/24  Yes Floydene Hy, PA-C  Multiple Vitamins-Minerals (MULTIVITAMIN WITH MINERALS) tablet Take 1 tablet by mouth daily.    [provider]  TRINESSA, 27, 0.18/0.215/0.25 MG-35 MCG tablet TAKE 1 TABLET BY MOUTH EVERY DAY 11/15/16   Sheron Dixons, MD    Family History Family History  Problem Relation Age of Onset   Diabetes Mother    Breast cancer Maternal Grandmother     Social History Social History   Tobacco Use   Smoking status: Never  Vaping Use   Vaping status: Never Used  Substance Use Topics   Alcohol use: No    Alcohol/week: 0.0 standard drinks of alcohol   Drug use: No     Allergies   Patient has no known allergies.   Review of Systems Review of Systems  Constitutional:  Positive for chills and fatigue. Negative for diaphoresis and fever.  HENT:  Positive for congestion,  rhinorrhea and sore throat. Negative for ear pain, sinus pressure and sinus pain.   Respiratory:  Positive for cough. Negative for shortness of breath.   Cardiovascular:  Negative for chest pain.  Gastrointestinal:  Negative for abdominal pain, nausea and vomiting.  Musculoskeletal:  Negative for arthralgias and myalgias.  Skin:  Negative for rash.  Neurological:  Positive for headaches. Negative for weakness.  Hematological:  Negative for adenopathy.     Physical Exam Triage Vital Signs ED Triage Vitals  Encounter Vitals Group     BP 03/18/24 1033 93/68     Systolic BP Percentile --      Diastolic BP Percentile --      Pulse Rate 03/18/24 1033 (!) 102     Resp 03/18/24 1033 16     Temp 03/18/24 1033 98.9 F (37.2 C)     Temp Source 03/18/24 1033 Oral     SpO2 03/18/24 1033 97 %     Weight 03/18/24 1032 119 lb 0.8 oz (54 kg)     Height 03/18/24 1032 5\' 5"  (1.651 m)     Head Circumference --      Peak Flow --      Pain Score 03/18/24 1032 8     Pain Loc --      Pain Education --      Exclude from Growth Chart --    No data found.  Updated Vital Signs BP 93/68 (BP Location: Right Arm)   Pulse (!) 102   Temp 98.9 F (37.2 C) (Oral)   Resp 16   Ht 5\' 5"  (1.651 m)   Wt 119 lb 0.8 oz (54 kg)   LMP 03/03/2024 (Approximate)   SpO2 97%   BMI 19.81 kg/m      Physical Exam Vitals and nursing note reviewed.  Constitutional:      General: She is not in acute distress.    Appearance: Normal appearance. She is not ill-appearing or toxic-appearing.  HENT:     Head: Normocephalic and atraumatic.     Nose: Congestion present.     Mouth/Throat:     Mouth: Mucous membranes are moist.     Pharynx: Oropharynx is clear. Posterior oropharyngeal erythema present.  Eyes:     General: No scleral icterus.       Right eye: No discharge.        Left eye: No discharge.     Conjunctiva/sclera: Conjunctivae normal.  Cardiovascular:     Rate and Rhythm: Normal rate and regular rhythm.      Heart sounds: Normal heart sounds.  Pulmonary:     Effort: Pulmonary effort is normal. No respiratory distress.     Breath sounds: Normal breath sounds.  Musculoskeletal:     Cervical back: Neck supple.  Skin:    General: Skin is dry.  Neurological:     General: No focal deficit present.     Mental Status: She is alert. Mental status is at baseline.     Motor: No weakness.     Gait: Gait normal.  Psychiatric:        Mood and Affect: Mood normal.        Behavior: Behavior normal.      UC Treatments / Results  Labs (all labs ordered are listed, but only abnormal results are displayed) Labs Reviewed  GROUP A STREP BY PCR  RESP PANEL BY RT-PCR (FLU A&B, COVID) [id number]    EKG   Radiology No results found.  Procedures Procedures (including critical care time)  Medications Ordered in UC Medications - No data to display  Initial Impression / Assessment and Plan / UC Course  I have reviewed the triage vital signs and the nursing notes.  Pertinent labs & imaging results that were available during my care of the patient were reviewed by me and considered in my medical decision making (see chart for details).   28 year old female presents complaining, cough, congestion and sore throat yesterday.  Recently flew to and from [place] .  Vitals are stable.  Afebrile.  Overall well-appearing.  No acute distress.  On exam has nasal congestion and mild erythema posterior pharynx.  Chest is clear.  Strep test and respiratory panel obtained.  Strep test negative. Resp panel negative.   Viral URI. Supportive care. Sent Promethazine DM. Reviewed return precautions.   Final Clinical Impressions(s) / UC Diagnoses   Final diagnoses:  Viral upper respiratory tract infection  Acute cough  Sore throat     Discharge Instructions      -Negative flu, COVID and strep  URI/COLD SYMPTOMS: Your exam today is consistent with a viral illness. Antibiotics are not indicated at this  time. Use medications as directed, including cough syrup, nasal saline, and decongestants. Your symptoms should improve over the next few days and resolve within 7-10 days. Increase rest and fluids. F/u if symptoms worsen or predominate such as sore throat, ear pain, productive cough, shortness  of breath, or if you develop high fevers or worsening fatigue over the next several days.     ED Prescriptions     Medication Sig Dispense Auth. Provider   promethazine-dextromethorphan (PROMETHAZINE-DM) 6.25-15 MG/5ML syrup Take 5 mLs by mouth 4 (four) times daily as needed. 118 mL Floydene Hy, PA-C      PDMP not reviewed this encounter.   Floydene Hy, PA-C 03/18/24 1131

## 2024-03-18 NOTE — ED Triage Notes (Signed)
 Pt c/o sore throat, cough, nasal congestion, chills, headache. Started yesterday. Unsure of fevers.

## 2024-03-18 NOTE — Discharge Instructions (Signed)
-  Negative flu, COVID and strep  URI/COLD SYMPTOMS: Your exam today is consistent with a viral illness. Antibiotics are not indicated at this time. Use medications as directed, including cough syrup, nasal saline, and decongestants. Your symptoms should improve over the next few days and resolve within 7-10 days. Increase rest and fluids. F/u if symptoms worsen or predominate such as sore throat, ear pain, productive cough, shortness of breath, or if you develop high fevers or worsening fatigue over the next several days.
